# Patient Record
Sex: Female | Born: 1990 | Race: Black or African American | Hispanic: No | Marital: Single | Smoking: Former smoker
Health system: Southern US, Community
[De-identification: ages and names within clinical notes are randomized; demographics above are authoritative.]

## PROBLEM LIST (undated history)

## (undated) DIAGNOSIS — J45909 Unspecified asthma, uncomplicated: Secondary | ICD-10-CM

---

## 2010-02-10 ENCOUNTER — Emergency Department (HOSPITAL_BASED_OUTPATIENT_CLINIC_OR_DEPARTMENT_OTHER): Admission: EM | Admit: 2010-02-10 | Discharge: 2010-02-10 | Payer: Self-pay | Admitting: Emergency Medicine

## 2014-02-10 ENCOUNTER — Encounter (HOSPITAL_BASED_OUTPATIENT_CLINIC_OR_DEPARTMENT_OTHER): Payer: Self-pay | Admitting: Emergency Medicine

## 2014-02-10 ENCOUNTER — Emergency Department (HOSPITAL_BASED_OUTPATIENT_CLINIC_OR_DEPARTMENT_OTHER)
Admission: EM | Admit: 2014-02-10 | Discharge: 2014-02-10 | Disposition: A | Discharge status: Home / Self Care | Payer: 59 | Attending: Emergency Medicine | Admitting: Emergency Medicine

## 2014-02-10 DIAGNOSIS — J018 Other acute sinusitis: Secondary | ICD-10-CM | POA: Insufficient documentation

## 2014-02-10 DIAGNOSIS — Z880 Allergy status to penicillin: Secondary | ICD-10-CM | POA: Diagnosis not present

## 2014-02-10 DIAGNOSIS — J029 Acute pharyngitis, unspecified: Secondary | ICD-10-CM | POA: Diagnosis present

## 2014-02-10 LAB — RAPID STREP SCREEN (MED CTR MEBANE ONLY): Streptococcus, Group A Screen (Direct): NEGATIVE

## 2014-02-10 MED ORDER — AZITHROMYCIN 250 MG PO TABS: 250.0000 mg | ORAL_TABLET | Freq: Every day | ORAL | Status: DC

## 2014-02-10 NOTE — ED Notes (Signed)
Dx with URI last week-c/o cont'd sore throat, head and chest congestion

## 2014-02-10 NOTE — Discharge Instructions (Signed)
Take antibiotic to completion. Use nasal spray and nasal saline. You may continue decongestants.  Sinusitis Sinusitis is redness, soreness, and inflammation of the paranasal sinuses. Paranasal sinuses are air pockets within the bones of your face (beneath the eyes, the middle of the forehead, or above the eyes). In healthy paranasal sinuses, mucus is able to drain out, and air is able to circulate through them by way of your nose. However, when your paranasal sinuses are inflamed, mucus and air can become trapped. This can allow bacteria and other germs to grow and cause infection. Sinusitis can develop quickly and last only a short time (acute) or continue over a long period (chronic). Sinusitis that lasts for more than 12 weeks is considered chronic.  CAUSES  Causes of sinusitis include:  Allergies.  Structural abnormalities, such as displacement of the cartilage that separates your nostrils (deviated septum), which can decrease the air flow through your nose and sinuses and affect sinus drainage.  Functional abnormalities, such as when the small hairs (cilia) that line your sinuses and help remove mucus do not work properly or are not present. SIGNS AND SYMPTOMS  Symptoms of acute and chronic sinusitis are the same. The primary symptoms are pain and pressure around the affected sinuses. Other symptoms include:  Upper toothache.  Earache.  Headache.  Bad breath.  Decreased sense of smell and taste.  A cough, which worsens when you are lying flat.  Fatigue.  Fever.  Thick drainage from your nose, which often is green and may contain pus (purulent).  Swelling and warmth over the affected sinuses. DIAGNOSIS  Your health care provider will perform a physical exam. During the exam, your health care provider may:  Look in your nose for signs of abnormal growths in your nostrils (nasal polyps).  Tap over the affected sinus to check for signs of infection.  View the inside of your  sinuses (endoscopy) using an imaging device that has a light attached (endoscope). If your health care provider suspects that you have chronic sinusitis, one or more of the following tests may be recommended:  Allergy tests.  Nasal culture. A sample of mucus is taken from your nose, sent to a lab, and screened for bacteria.  Nasal cytology. A sample of mucus is taken from your nose and examined by your health care provider to determine if your sinusitis is related to an allergy. TREATMENT  Most cases of acute sinusitis are related to a viral infection and will resolve on their own within 10 days. Sometimes medicines are prescribed to help relieve symptoms (pain medicine, decongestants, nasal steroid sprays, or saline sprays).  However, for sinusitis related to a bacterial infection, your health care provider will prescribe antibiotic medicines. These are medicines that will help kill the bacteria causing the infection.  Rarely, sinusitis is caused by a fungal infection. In theses cases, your health care provider will prescribe antifungal medicine. For some cases of chronic sinusitis, surgery is needed. Generally, these are cases in which sinusitis recurs more than 3 times per year, despite other treatments. HOME CARE INSTRUCTIONS   Drink plenty of water. Water helps thin the mucus so your sinuses can drain more easily.  Use a humidifier.  Inhale steam 3 to 4 times a day (for example, sit in the bathroom with the shower running).  Apply a warm, moist washcloth to your face 3 to 4 times a day, or as directed by your health care provider.  Use saline nasal sprays to help moisten and  clean your sinuses.  Take medicines only as directed by your health care provider.  If you were prescribed either an antibiotic or antifungal medicine, finish it all even if you start to feel better. SEEK IMMEDIATE MEDICAL CARE IF:  You have increasing pain or severe headaches.  You have nausea, vomiting, or  drowsiness.  You have swelling around your face.  You have vision problems.  You have a stiff neck.  You have difficulty breathing. MAKE SURE YOU:   Understand these instructions.  Will watch your condition.  Will get help right away if you are not doing well or get worse. Document Released: 04/09/2005 Document Revised: 08/24/2013 Document Reviewed: 04/24/2011 [facility] Patient Information 2015 [place], [place]. This information is not intended to replace advice given to you by your health care provider. Make sure you discuss any questions you have with your health care provider.

## 2014-02-10 NOTE — ED Provider Notes (Signed)
CSN: [id number]     Arrival date & time 02/10/14  1541 History   First MD Initiated Contact with Patient 02/10/14 1545     Chief Complaint  Patient presents with  . URI     (Consider location/radiation/quality/duration/timing/severity/associated sxs/prior Treatment) HPI This is a 23 year old female who presents to the emergency department complaining of sinus pressure, sore throat and slightly productive cough x2 weeks. Patient was seen one week ago by her PCP who diagnosed her with an upper respiratory infection, told her to take Robitussin with decongestant which she has been doing with no relief. States she has a lot of pressure behind her sinuses and her chest and nose still congested. Denies fever, chills, nausea or vomiting. Her son was recently sick with an ear infection. History reviewed. No pertinent past medical history. History reviewed. No pertinent past surgical history. No family history on file. History  Substance Use Topics  . Smoking status: Never Smoker   . Smokeless tobacco: Not on file  . Alcohol Use: No   OB History   Grav Para Term Preterm Abortions TAB SAB Ect Mult Living                 Review of Systems  Constitutional: Negative for fever and chills.  HENT: Positive for congestion, rhinorrhea, sinus pressure, sore throat and voice change (hoarse voice).   Eyes: Negative.   Respiratory: Positive for cough.   Cardiovascular: Negative for chest pain.  Gastrointestinal: Negative for nausea and vomiting.  Musculoskeletal: Negative.   Skin: Negative.       Allergies  Penicillins  Home Medications   Prior to Admission medications   Medication Sig Start Date End Date Taking? Authorizing Provider  azithromycin (ZITHROMAX) 250 MG tablet Take 1 tablet (250 mg total) by mouth daily. Take first 2 tablets together, then 1 every day until finished. 02/10/14   Zenas Santa M Kevyn Boquet, PA-C   BP 110/70  Pulse 78  Temp(Src) 98.5 F (36.9 C) (Oral)  Resp 16  Ht 5\' 2"   (1.575 m)  SpO2 97%  LMP 02/01/2014 Physical Exam  Nursing note and vitals reviewed. Constitutional: She is oriented to person, place, and time. She appears well-developed and well-nourished. No distress.  HENT:  Head: Normocephalic and atraumatic.  Nasal congestion, mucosal edema, post nasal drip. Post oropharyngeal erythema without edema or exudate. Bilateral frontal and maxillary sinus tenderness.  Eyes: Conjunctivae and EOM are normal.  Neck: Normal range of motion. Neck supple.  Cardiovascular: Normal rate, regular rhythm and normal heart sounds.   Pulmonary/Chest: Effort normal and breath sounds normal. No respiratory distress. She has no wheezes.  Musculoskeletal: Normal range of motion. She exhibits no edema.  Neurological: She is alert and oriented to person, place, and time. No sensory deficit.  Skin: Skin is warm and dry.  Psychiatric: She has a normal mood and affect. Her behavior is normal.    ED Course  Procedures (including critical care time) Labs Review Labs Reviewed  RAPID STREP SCREEN    Imaging Review No results found.   EKG Interpretation None      MDM   Final diagnoses:  Other acute sinusitis   Patient nontoxic appearing and in no apparent distress. Afebrile, vital signs stable. Lungs clear. Given symptoms have been present for 2 weeks and failed outpatient treatment, will treat with antibiotics. Followup with PCP. Stable for discharge. Return precautions given. Patient states understanding of treatment care plan and is agreeable.   Kathrynn SpeedRobyn M Damichael Hofman, PA-C 02/10/14 1621

## 2014-02-10 NOTE — ED Notes (Signed)
Pa  at bedside. 

## 2014-02-10 NOTE — ED Provider Notes (Signed)
Medical screening examination/treatment/procedure(s) were performed by non-physician practitioner and as supervising physician I was immediately available for consultation/collaboration.   Trey Luetta Piazza, MD 02/10/14 2335 

## 2014-02-12 LAB — CULTURE, GROUP A STREP

## 2014-06-26 ENCOUNTER — Emergency Department (HOSPITAL_BASED_OUTPATIENT_CLINIC_OR_DEPARTMENT_OTHER)
Admission: EM | Admit: 2014-06-26 | Discharge: 2014-06-26 | Disposition: A | Discharge status: Home / Self Care | Payer: 59 | Attending: Emergency Medicine | Admitting: Emergency Medicine

## 2014-06-26 ENCOUNTER — Encounter (HOSPITAL_BASED_OUTPATIENT_CLINIC_OR_DEPARTMENT_OTHER): Payer: Self-pay

## 2014-06-26 ENCOUNTER — Emergency Department (HOSPITAL_BASED_OUTPATIENT_CLINIC_OR_DEPARTMENT_OTHER): Payer: 59

## 2014-06-26 DIAGNOSIS — Z791 Long term (current) use of non-steroidal anti-inflammatories (NSAID): Secondary | ICD-10-CM | POA: Diagnosis not present

## 2014-06-26 DIAGNOSIS — Z720 Tobacco use: Secondary | ICD-10-CM | POA: Diagnosis not present

## 2014-06-26 DIAGNOSIS — Z880 Allergy status to penicillin: Secondary | ICD-10-CM | POA: Insufficient documentation

## 2014-06-26 DIAGNOSIS — Z3202 Encounter for pregnancy test, result negative: Secondary | ICD-10-CM | POA: Insufficient documentation

## 2014-06-26 DIAGNOSIS — R079 Chest pain, unspecified: Secondary | ICD-10-CM | POA: Insufficient documentation

## 2014-06-26 DIAGNOSIS — J45901 Unspecified asthma with (acute) exacerbation: Secondary | ICD-10-CM | POA: Diagnosis not present

## 2014-06-26 DIAGNOSIS — Z79899 Other long term (current) drug therapy: Secondary | ICD-10-CM | POA: Insufficient documentation

## 2014-06-26 HISTORY — DX: Unspecified asthma, uncomplicated: J45.909

## 2014-06-26 LAB — URINALYSIS, ROUTINE W REFLEX MICROSCOPIC
BILIRUBIN URINE: NEGATIVE
Glucose, UA: NEGATIVE mg/dL
Hgb urine dipstick: NEGATIVE
Ketones, ur: NEGATIVE mg/dL
Leukocytes, UA: NEGATIVE
NITRITE: NEGATIVE
PH: 7.5 (ref 5.0–8.0)
Protein, ur: NEGATIVE mg/dL
Specific Gravity, Urine: 1.027 (ref 1.005–1.030)
Urobilinogen, UA: 1 mg/dL (ref 0.0–1.0)

## 2014-06-26 LAB — PREGNANCY, URINE: Preg Test, Ur: NEGATIVE

## 2014-06-26 NOTE — ED Provider Notes (Signed)
CSN: [id number]     Arrival date & time 06/26/14  4098 History  This chart was scribed for Rolan Bucco, MD by Haywood Pao, ED Scribe. The patient was seen in [facility] and the patient's care was started at 10:25 PM.  Chief Complaint  Patient presents with  . Chest Pain   Patient is a 24 y.o. female presenting with chest pain. The history is provided by the patient. No language interpreter was used.  Chest Pain Associated symptoms: shortness of breath   Associated symptoms: no abdominal pain, no back pain, no cough, no diaphoresis, no dizziness, no fatigue, no fever, no headache, no nausea, no numbness, not vomiting and no weakness     HPI Comments: Alexandra Carrillo is a 24 y.o. female with a history of asthma who presents to the Emergency Department complaining of a gradually worsening, constant chest pain, onset this morning. Pt states the pain has been ongoing all day but worsened around 5:30 PM after work. The pain is located in the middle of her chest and radiates under her both breast. She has improving SOB as an associated symptom. Pt has used her albuterol inhaler and got relief of symptoms after this. Nothing makes the pain worse. Pt states this does not feel like her normal asthma flare up. Pt has a birth control implant in her arm. She does smoke cigarettes. She denies a cough, congestion, fever, leg swelling, pain with breathing abdominal pain, nausea, vomiting, diarrhea, dizziness.  Past Medical History  Diagnosis Date  . Asthma    History reviewed. No pertinent past surgical history. No family history on file. History  Substance Use Topics  . Smoking status: Current Every Day Smoker -- 0.50 packs/day    Types: Cigarettes  . Smokeless tobacco: Not on file  . Alcohol Use: No   OB History    No data available     Review of Systems  Constitutional: Negative for fever, chills, diaphoresis and fatigue.  HENT: Negative for congestion, rhinorrhea and sneezing.   Eyes:  Negative.   Respiratory: Positive for shortness of breath. Negative for cough and chest tightness.   Cardiovascular: Positive for chest pain. Negative for leg swelling.  Gastrointestinal: Negative for nausea, vomiting, abdominal pain, diarrhea and blood in stool.  Genitourinary: Negative for frequency, hematuria, flank pain and difficulty urinating.  Musculoskeletal: Negative for back pain and arthralgias.  Skin: Negative for rash.  Neurological: Negative for dizziness, speech difficulty, weakness, numbness and headaches.   Allergies  Penicillins  Home Medications   Prior to Admission medications   Medication Sig Start Date End Date Taking? Authorizing Provider  albuterol (PROVENTIL) (5 MG/ML) 0.5% nebulizer solution Take 2.5 mg by nebulization every 6 (six) hours as needed for wheezing or shortness of breath.   Yes Historical Provider, MD  azithromycin (ZITHROMAX) 250 MG tablet Take 1 tablet (250 mg total) by mouth daily. Take first 2 tablets together, then 1 every day until finished. 02/10/14   Robyn M Hess, PA-C   BP 105/88 mmHg  Pulse 76  Temp(Src) 98.3 F (36.8 C) (Oral)  Resp 20  Ht  (1.549 m)  Wt 132 lb (59.875 kg)  BMI 24.95 kg/m2  SpO2 100%  LMP 06/26/2014 Physical Exam  Constitutional: She is oriented to person, place, and time. She appears well-developed and well-nourished.  HENT:  Head: Normocephalic and atraumatic.  Eyes: Pupils are equal, round, and reactive to light.  Neck: Normal range of motion. Neck supple.  Cardiovascular: Normal rate, regular rhythm  and normal heart sounds.   Pulmonary/Chest: Effort normal and breath sounds normal. No respiratory distress. She has no wheezes. She has no rales. She exhibits tenderness (ppatient is reproducible tenderness along the sternum).  Abdominal: Soft. Bowel sounds are normal. There is no tenderness. There is no rebound and no guarding.  Musculoskeletal: Normal range of motion. She exhibits no edema.   Lymphadenopathy:    She has no cervical adenopathy.  Neurological: She is alert and oriented to person, place, and time.  Skin: Skin is warm and dry. No rash noted.  Psychiatric: She has a normal mood and affect.    ED Course  Procedures  DIAGNOSTIC STUDIES: Oxygen Saturation is 100% on room air, normal by my interpretation.    COORDINATION OF CARE: 10:30 PM Discussed treatment plan with pt at bedside and pt agreed to plan.  Labs Review Labs Reviewed  URINALYSIS, ROUTINE W REFLEX MICROSCOPIC  PREGNANCY, URINE    Imaging Review Dg Chest 2 View  06/26/2014   CLINICAL DATA:  Initial evaluation chest pain which began this morning  EXAM: CHEST  2 VIEW  COMPARISON:  None.  FINDINGS: The heart size and mediastinal contours are within normal limits. Both lungs are clear. The visualized skeletal structures are unremarkable.  IMPRESSION: No active cardiopulmonary disease.   Electronically Signed   By: Esperanza Heiraymond  Rubner M.D.   On: 06/26/2014 23:04     EKG Interpretation None      Date: 06/26/2014  Rate: 73  Rhythm: normal sinus rhythm  QRS Axis: normal  Intervals: normal  ST/T Wave abnormalities: normal  Conduction Disutrbances:none  Narrative Interpretation:   Old EKG Reviewed: none available   MDM   Final diagnoses:  Chest pain, unspecified chest pain type   , Patient presents with chest pain along her center of her chest and radiating to both sides. The pain is reproducible and did improve after using her inhaler. Her EKG does not show ischemia. Her chest x-ray is negative for infiltrates or pneumothorax. She's feeling much better now. She was discharged home in good condition. She was instructed to use her albuterol regularly for the next few days and use ibuprofen for the discomfort. She was advised to follow-up with her primary care physician if her symptoms continue or return here as needed if her symptoms worsen.   I personally performed the services described in this  documentation, which was scribed in my presence.  The recorded information has been reviewed and considered.      Rolan BuccoMelanie Cindy Brindisi, MD 06/26/14 [phone]

## 2014-06-26 NOTE — ED Notes (Signed)
Pt reports today with intermittent pain in central chest, now radiating to bilateral chest walls and bilateral upper quads of abdomen.  Reports sob, states she used inhaler earlier today, hx of asthma.  Denies additional symptoms.

## 2014-06-26 NOTE — ED Notes (Signed)
Pt dressing self, "ready to go", denies questions or needs, steady gait, "feel better".

## 2014-06-26 NOTE — ED Notes (Addendum)
Pt alert, NAD, calm, interactive, no dyspnea noted, speaking in clear complete sentences. Last inhaler use 3 hrs ago, LS CTA, last ate 1815. (denies: cough, congestion, cold sx, fever, nvd or dizziness), admits to a little sob at this time, rates pain at this time 8/10. Sternal area pain.

## 2014-06-26 NOTE — Discharge Instructions (Signed)

## 2014-06-30 ENCOUNTER — Emergency Department (HOSPITAL_BASED_OUTPATIENT_CLINIC_OR_DEPARTMENT_OTHER): Payer: Medicaid Other

## 2014-06-30 ENCOUNTER — Emergency Department (HOSPITAL_BASED_OUTPATIENT_CLINIC_OR_DEPARTMENT_OTHER)
Admission: EM | Admit: 2014-06-30 | Discharge: 2014-06-30 | Disposition: A | Discharge status: Home / Self Care | Payer: Medicaid Other | Attending: Emergency Medicine | Admitting: Emergency Medicine

## 2014-06-30 ENCOUNTER — Encounter (HOSPITAL_BASED_OUTPATIENT_CLINIC_OR_DEPARTMENT_OTHER): Payer: Self-pay | Admitting: *Deleted

## 2014-06-30 DIAGNOSIS — Z87891 Personal history of nicotine dependence: Secondary | ICD-10-CM | POA: Insufficient documentation

## 2014-06-30 DIAGNOSIS — Z792 Long term (current) use of antibiotics: Secondary | ICD-10-CM | POA: Diagnosis not present

## 2014-06-30 DIAGNOSIS — S3991XA Unspecified injury of abdomen, initial encounter: Secondary | ICD-10-CM | POA: Diagnosis present

## 2014-06-30 DIAGNOSIS — Z880 Allergy status to penicillin: Secondary | ICD-10-CM | POA: Diagnosis not present

## 2014-06-30 DIAGNOSIS — Y9389 Activity, other specified: Secondary | ICD-10-CM | POA: Insufficient documentation

## 2014-06-30 DIAGNOSIS — Y998 Other external cause status: Secondary | ICD-10-CM | POA: Insufficient documentation

## 2014-06-30 DIAGNOSIS — Y92410 Unspecified street and highway as the place of occurrence of the external cause: Secondary | ICD-10-CM | POA: Diagnosis not present

## 2014-06-30 DIAGNOSIS — J45909 Unspecified asthma, uncomplicated: Secondary | ICD-10-CM | POA: Insufficient documentation

## 2014-06-30 DIAGNOSIS — S4991XA Unspecified injury of right shoulder and upper arm, initial encounter: Secondary | ICD-10-CM | POA: Diagnosis not present

## 2014-06-30 DIAGNOSIS — S7002XA Contusion of left hip, initial encounter: Secondary | ICD-10-CM | POA: Diagnosis not present

## 2014-06-30 DIAGNOSIS — R1030 Lower abdominal pain, unspecified: Secondary | ICD-10-CM

## 2014-06-30 DIAGNOSIS — Z3202 Encounter for pregnancy test, result negative: Secondary | ICD-10-CM | POA: Insufficient documentation

## 2014-06-30 DIAGNOSIS — M7918 Myalgia, other site: Secondary | ICD-10-CM

## 2014-06-30 LAB — URINALYSIS, ROUTINE W REFLEX MICROSCOPIC
Glucose, UA: NEGATIVE mg/dL
Hgb urine dipstick: NEGATIVE
Ketones, ur: 15 mg/dL — AB
Leukocytes, UA: NEGATIVE
NITRITE: NEGATIVE
Protein, ur: NEGATIVE mg/dL
SPECIFIC GRAVITY, URINE: 1.036 — AB (ref 1.005–1.030)
UROBILINOGEN UA: 1 mg/dL (ref 0.0–1.0)
pH: 6 (ref 5.0–8.0)

## 2014-06-30 LAB — CREATININE, SERUM
Creatinine, Ser: 0.82 mg/dL (ref 0.50–1.10)
GFR calc Af Amer: >90 mL/min (ref >90)
GFR calc non Af Amer: >90 mL/min (ref >90)

## 2014-06-30 LAB — PREGNANCY, URINE: Preg Test, Ur: NEGATIVE

## 2014-06-30 MED ORDER — IOHEXOL 300 MG/ML  SOLN
100.0000 mL | Freq: Once | INTRAMUSCULAR | Status: AC | PRN
  Administered 2014-06-30: 100 mL via INTRAVENOUS

## 2014-06-30 MED ORDER — OXYCODONE-ACETAMINOPHEN 5-325 MG PO TABS: ORAL_TABLET | ORAL | Status: DC

## 2014-06-30 MED ORDER — MORPHINE SULFATE 4 MG/ML IJ SOLN
4.0000 mg | Freq: Once | INTRAMUSCULAR | Status: AC
  Administered 2014-06-30: 4 mg via INTRAVENOUS
  Filled 2014-06-30: qty 1

## 2014-06-30 MED ORDER — ONDANSETRON HCL 4 MG/2ML IJ SOLN
4.0000 mg | Freq: Once | INTRAMUSCULAR | Status: AC
  Administered 2014-06-30: 4 mg via INTRAVENOUS
  Filled 2014-06-30: qty 2
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
## 2014-06-30 NOTE — ED Notes (Signed)
Patient asked to change into a gown.  
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
## 2014-06-30 NOTE — Discharge Instructions (Signed)
Take percocet for breakthrough pain, do not drink alcohol, drive, care for children or do other critical tasks while taking percocet. ° °Please follow with your primary care doctor in the next 2 days for a check-up. They must obtain records for further management.  ° °Do not hesitate to return to the Emergency Department for any new, worsening or concerning symptoms.  ° °

## 2016-02-24 ENCOUNTER — Encounter (HOSPITAL_BASED_OUTPATIENT_CLINIC_OR_DEPARTMENT_OTHER): Payer: Self-pay | Admitting: *Deleted

## 2016-02-24 ENCOUNTER — Emergency Department (HOSPITAL_BASED_OUTPATIENT_CLINIC_OR_DEPARTMENT_OTHER): Payer: 59

## 2016-02-24 ENCOUNTER — Emergency Department (HOSPITAL_BASED_OUTPATIENT_CLINIC_OR_DEPARTMENT_OTHER)
Admission: EM | Admit: 2016-02-24 | Discharge: 2016-02-24 | Disposition: A | Discharge status: Home / Self Care | Payer: 59 | Attending: Emergency Medicine | Admitting: Emergency Medicine

## 2016-02-24 DIAGNOSIS — J069 Acute upper respiratory infection, unspecified: Secondary | ICD-10-CM | POA: Diagnosis not present

## 2016-02-24 DIAGNOSIS — J45909 Unspecified asthma, uncomplicated: Secondary | ICD-10-CM | POA: Diagnosis not present

## 2016-02-24 DIAGNOSIS — Z87891 Personal history of nicotine dependence: Secondary | ICD-10-CM | POA: Diagnosis not present

## 2016-02-24 DIAGNOSIS — R05 Cough: Secondary | ICD-10-CM | POA: Diagnosis present

## 2016-02-24 LAB — RAPID STREP SCREEN (MED CTR MEBANE ONLY): STREPTOCOCCUS, GROUP A SCREEN (DIRECT): NEGATIVE

## 2016-02-24 MED ORDER — DEXAMETHASONE SODIUM PHOSPHATE 10 MG/ML IJ SOLN
10.0000 mg | Freq: Once | INTRAMUSCULAR | Status: AC
  Administered 2016-02-24: 10 mg via INTRAMUSCULAR
  Filled 2016-02-24: qty 1

## 2016-02-24 NOTE — ED Provider Notes (Signed)
[facility] Provider Note   CSN: [id number] Arrival date & time: 02/24/16  1738  By signing my name below, I, Alexandra Carrillo, attest that this documentation has been prepared under the direction and in the presence of non-physician practitioner, Alexandra Mornavid Gabriellia Rempel, NP. Electronically Signed: Modena JanskyAlbert Carrillo, Scribe. 02/24/2016. 6:17 PM.  History   Chief Complaint Chief Complaint  Patient presents with  . URI   The history is provided by the patient. No language interpreter was used.   HPI Comments: Alexandra Carrillo is a 25 y.o. female who presents to the Emergency Department complaining of intermittent moderate cough that started about 5 days ago. She states she has been having URI-like symptoms that remained unchanged after seeing her PCP. She reports associated symptoms of sore throat and a subjective fever. She states that she had a strep test done when she saw her PCP, which was negative. She tried cough medication prescribed by PCP, tylenol, ibuprofen, and cough drops with minimal relief. She describes the sore throat as exacerbated by swallowing. She denies any rhinorrhea, nausea, vomiting, rash, or other complaints.   Past Medical History:  Diagnosis Date  . Asthma     There are no active problems to display for this patient.   History reviewed. No pertinent surgical history.  OB History    No data available       Home Medications    Prior to Admission medications   Medication Sig Start Date End Date Taking? Authorizing Provider  albuterol (PROVENTIL) (5 MG/ML) 0.5% nebulizer solution Take 2.5 mg by nebulization every 6 (six) hours as needed for wheezing or shortness of breath.    Historical Provider, MD    Family History History reviewed. No pertinent family history.  Social History Social History  Substance Use Topics  . Smoking status: Former Smoker    Packs/day: 0.50    Types: Cigarettes  . Smokeless tobacco: Not on file  . Alcohol use No     Allergies    Penicillins   Review of Systems Review of Systems  Constitutional: Positive for fever (Subjective).  HENT: Positive for sore throat. Negative for rhinorrhea.   Gastrointestinal: Negative for nausea and vomiting.  Skin: Negative for rash.  All other systems reviewed and are negative.    Physical Exam Updated Vital Signs BP 113/80 (BP Location: Left Arm)   Pulse 95   Temp 98.4 F (36.9 C) (Oral)   Resp 18   Ht 5\' 2"  (1.575 m)   Wt 150 lb (68 kg)   LMP 02/12/2016   SpO2 97%   BMI 27.44 kg/m   Physical Exam  Constitutional: She appears well-developed and well-nourished. No distress.  HENT:  Head: Normocephalic.  Right Ear: Tympanic membrane normal.  Left Ear: Tympanic membrane normal.  Erythema to the posterior oropharynx.   Eyes: Conjunctivae are normal.  Neck: Neck supple.  Cardiovascular: Normal rate and regular rhythm.   Pulmonary/Chest: Effort normal.  Abdominal: Soft.  Musculoskeletal: Normal range of motion.  Lymphadenopathy:    She has no cervical adenopathy.  Neurological: She is alert.  Skin: Skin is warm and dry.  Psychiatric: She has a normal mood and affect.  Nursing note and vitals reviewed.    ED Treatments / Results  DIAGNOSTIC STUDIES: Oxygen Saturation is 97% on RA, normal by my interpretation.    COORDINATION OF CARE: 6:21 PM- Pt advised of plan for treatment and pt agrees.  Labs (all labs ordered are listed, but only abnormal results are displayed) Labs Reviewed  RAPID STREP SCREEN (NOT AT [facility])    EKG  EKG Interpretation None       Radiology Dg Chest 2 View  Result Date: 02/24/2016 CLINICAL DATA:  Acute onset of cough and congestion. Initial encounter. EXAM: CHEST  2 VIEW COMPARISON:  Chest radiograph performed 06/26/2014 FINDINGS: The lungs are well-aerated and clear. There is no evidence of focal opacification, pleural effusion or pneumothorax. The heart is normal in size; the mediastinal contour is within normal limits. No  acute osseous abnormalities are seen. IMPRESSION: No acute cardiopulmonary process seen. Electronically Signed   By: Roanna RaiderJeffery  Chang M.D.   On: 02/24/2016 18:51    Procedures Procedures (including critical care time)  Medications Ordered in ED Medications - No data to display   Initial Impression / Assessment and Plan / ED Course  I have reviewed the triage vital signs and the nursing notes.  Pertinent labs & imaging results that were available during my care of the patient were reviewed by me and considered in my medical decision making (see chart for details).  Clinical Course   Pt symptoms consistent with URI. CXR negative for acute infiltrate. Negative strep. Pt will be discharged with symptomatic treatment.  Discussed return precautions.  Pt is hemodynamically stable & in NAD prior to discharge.   Final Clinical Impressions(s) / ED Diagnoses   Final diagnoses:  Viral upper respiratory tract infection    New Prescriptions New Prescriptions   No medications on file   I personally performed the services described in this documentation, which was scribed in my presence. The recorded information has been reviewed and is accurate.     Alexandra Mornavid Emre Stock, NP 02/24/16 2300    Geoffery Lyonsouglas Delo, MD 02/25/16 0000

## 2016-02-24 NOTE — ED Notes (Signed)
Pt. Reports she has been out of work all week because she has a sore throat and cold symptoms.  Pt. Reports she saw her PMD at [facility].  Pt. Reports she had strep test and it was negative.  Pt. Still reports sore throat.

## 2016-02-24 NOTE — ED Triage Notes (Signed)
Pt reports cough since Sunday.  Reports negative strep test at her PCP this week, reports being rx a cough medication that she does not feel like is helping. Denies fever.

## 2016-02-27 LAB — CULTURE, GROUP A STREP (THRC)

## 2017-06-27 IMAGING — DX DG CHEST 2V
2 series · 2 of 2 positions shown · non-contrast
Comparison: Chest radiograph performed 06/26/2014

CLINICAL DATA: Acute onset of cough and congestion. Initial
encounter.

EXAM:
CHEST  2 VIEW

[chest pa]
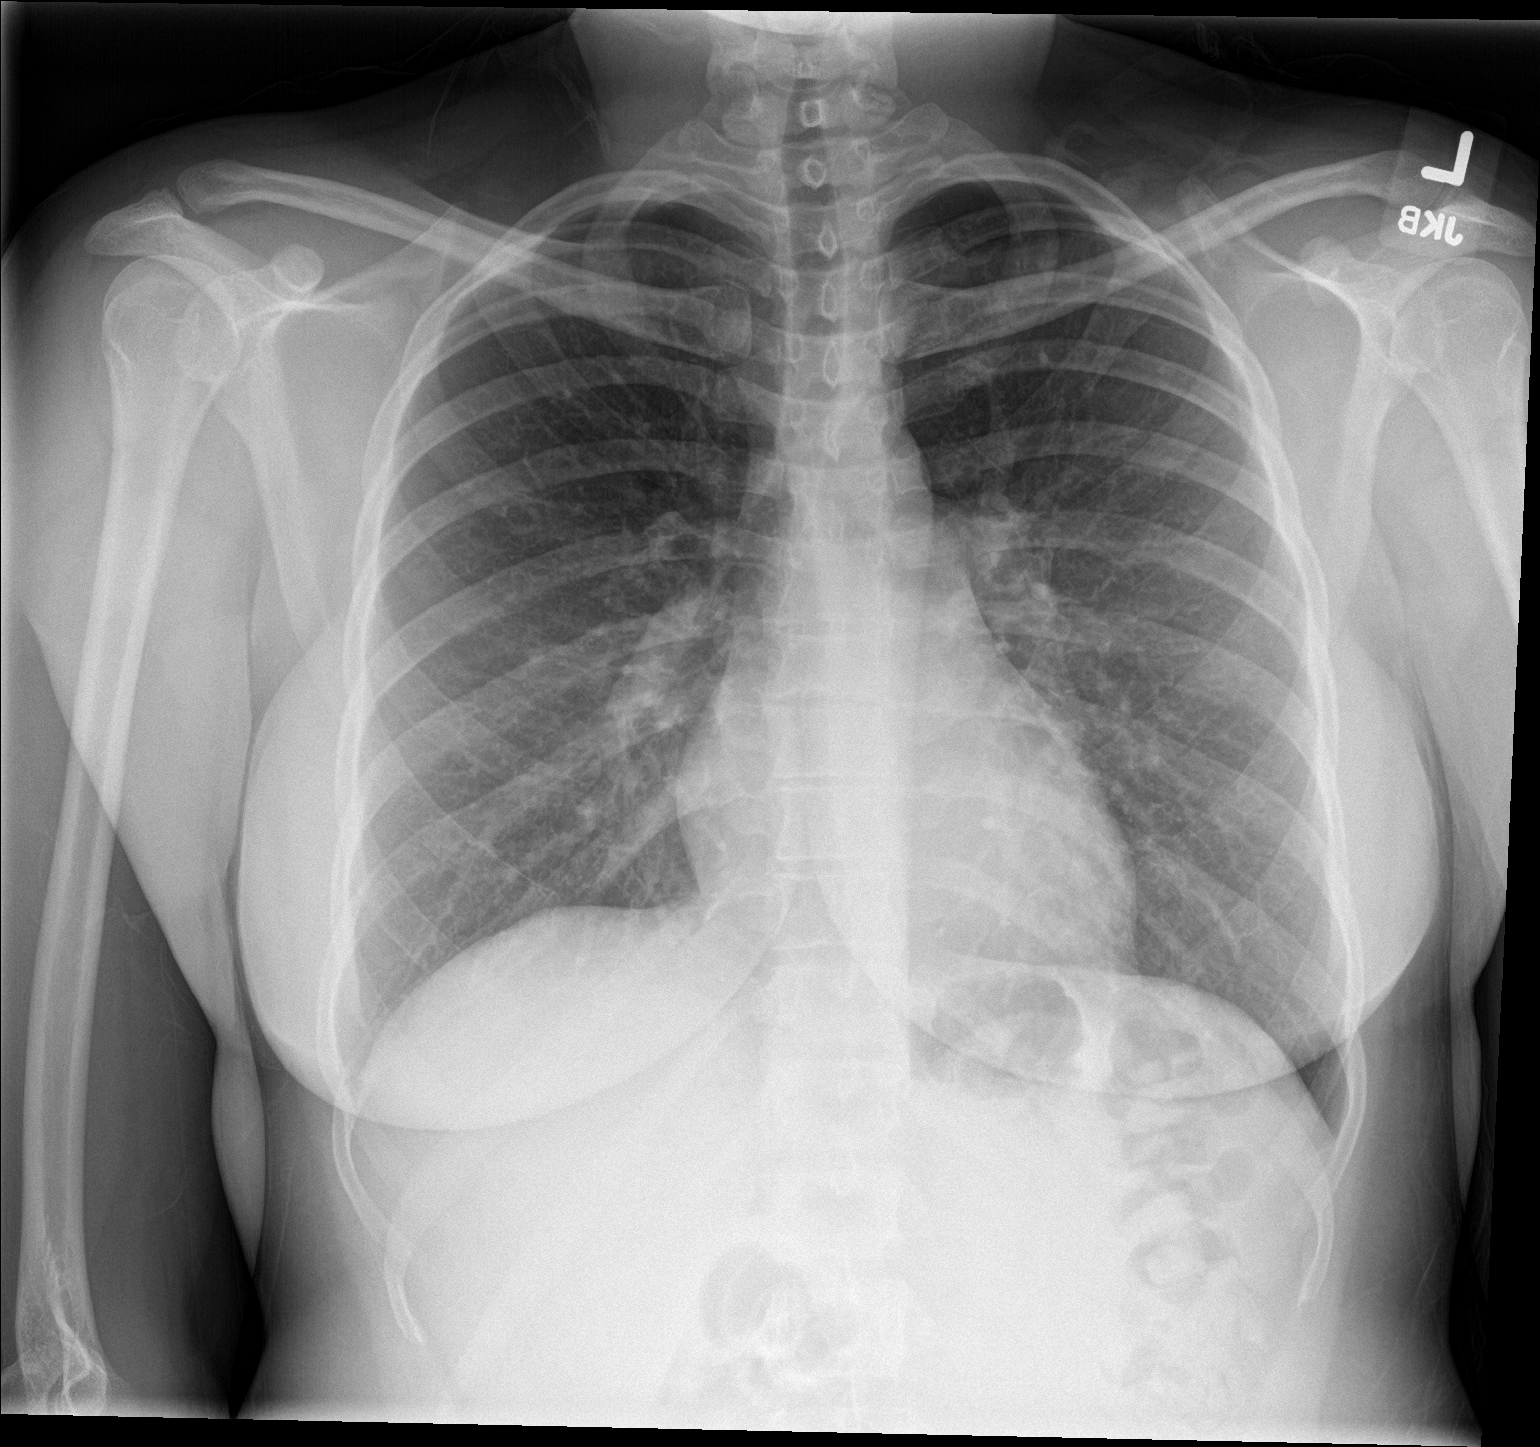

[chest lat]
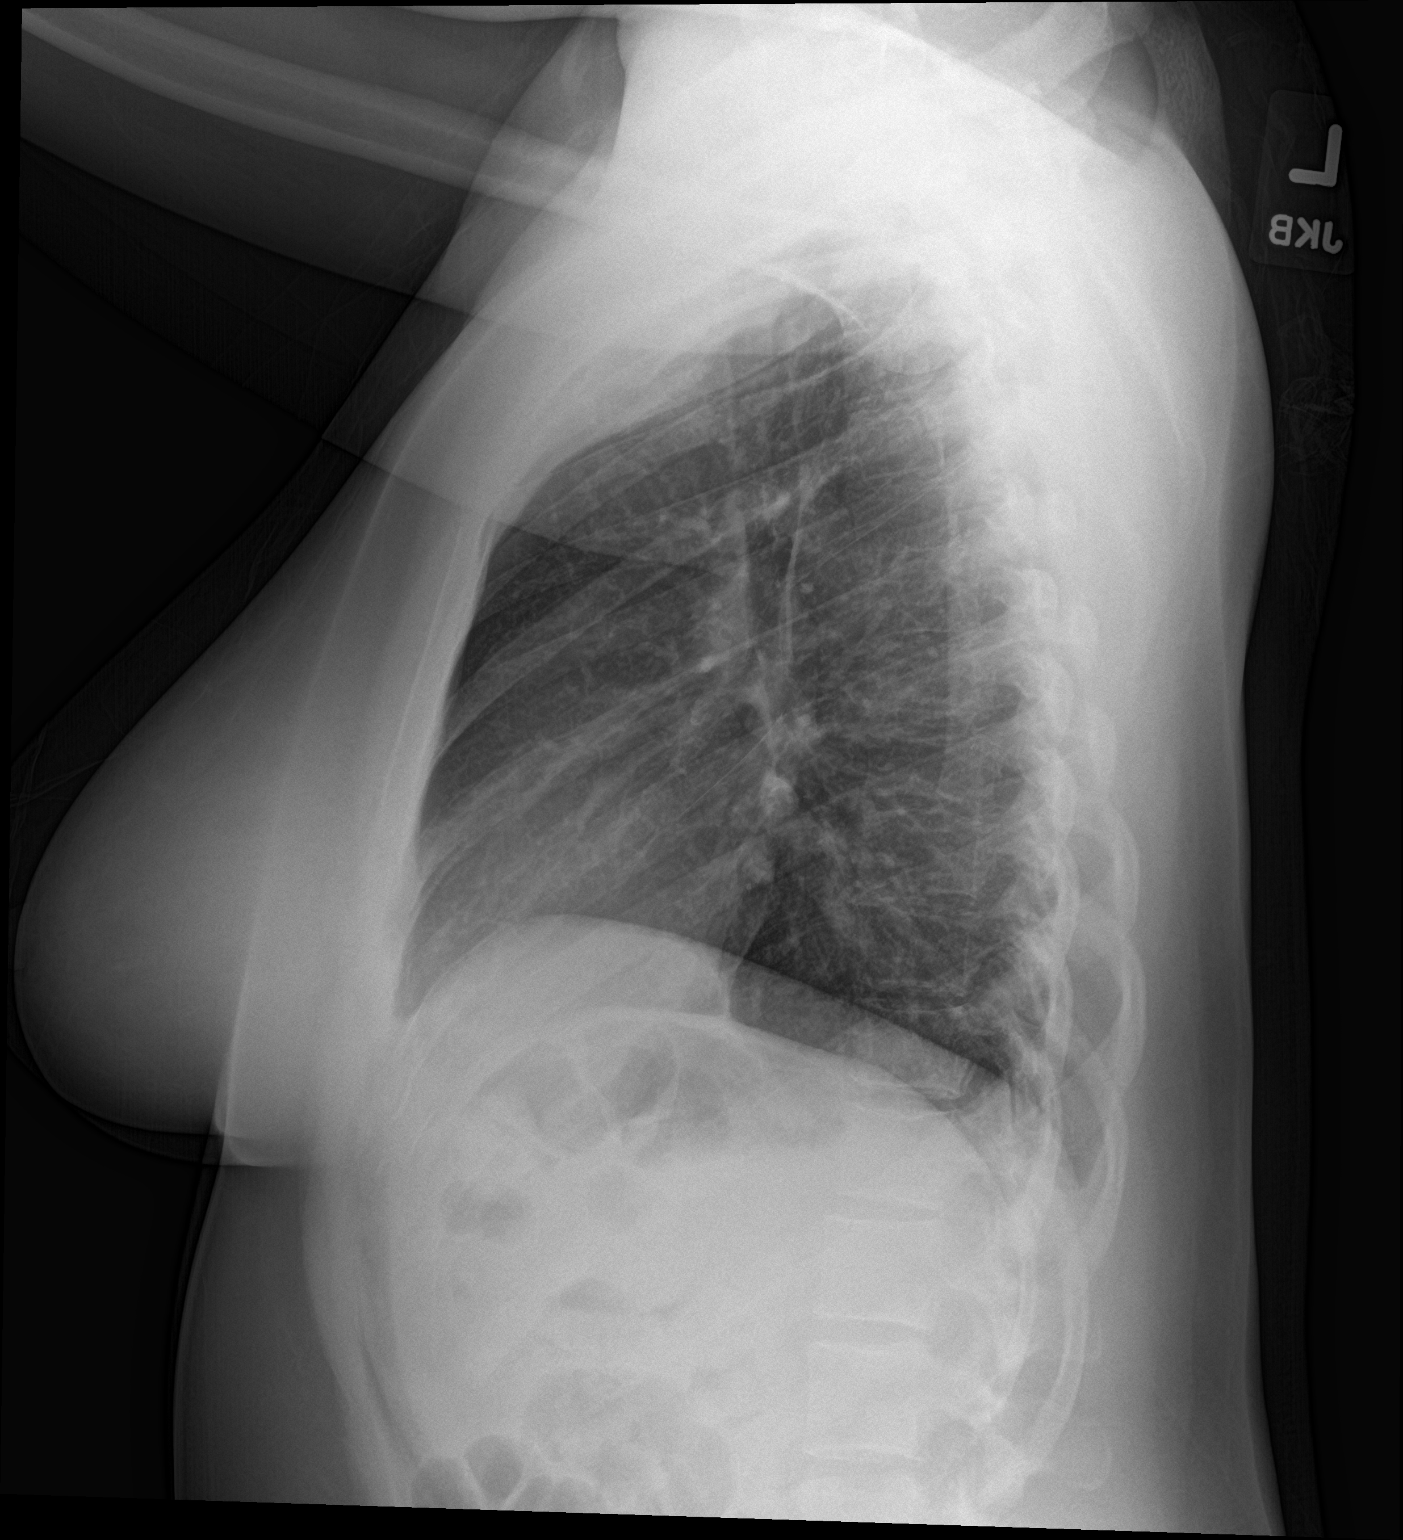

[2 of 2 positions shown; findings below may reference images not displayed]

FINDINGS: The lungs are well-aerated and clear. There is no evidence of focal
opacification, pleural effusion or pneumothorax.

The heart is normal in size; the mediastinal contour is within
normal limits. No acute osseous abnormalities are seen.
IMPRESSION: No acute cardiopulmonary process seen.

## 2018-03-27 ENCOUNTER — Emergency Department (HOSPITAL_BASED_OUTPATIENT_CLINIC_OR_DEPARTMENT_OTHER)
Admission: EM | Admit: 2018-03-27 | Discharge: 2018-03-27 | Disposition: A | Discharge status: Home / Self Care | Payer: BC Managed Care – PPO | Attending: Emergency Medicine | Admitting: Emergency Medicine

## 2018-03-27 ENCOUNTER — Encounter (HOSPITAL_BASED_OUTPATIENT_CLINIC_OR_DEPARTMENT_OTHER): Payer: Self-pay | Admitting: *Deleted

## 2018-03-27 ENCOUNTER — Other Ambulatory Visit: Payer: Self-pay

## 2018-03-27 DIAGNOSIS — J45909 Unspecified asthma, uncomplicated: Secondary | ICD-10-CM | POA: Insufficient documentation

## 2018-03-27 DIAGNOSIS — R69 Illness, unspecified: Secondary | ICD-10-CM

## 2018-03-27 DIAGNOSIS — Z87891 Personal history of nicotine dependence: Secondary | ICD-10-CM | POA: Insufficient documentation

## 2018-03-27 DIAGNOSIS — J111 Influenza due to unidentified influenza virus with other respiratory manifestations: Secondary | ICD-10-CM | POA: Insufficient documentation

## 2018-03-27 DIAGNOSIS — R51 Headache: Secondary | ICD-10-CM | POA: Diagnosis present

## 2018-03-27 MED ORDER — BENZONATATE 200 MG PO CAPS: 200.0000 mg | ORAL_CAPSULE | Freq: Three times a day (TID) | ORAL | 0 refills | Status: AC | PRN

## 2018-03-27 MED ORDER — HYDROCODONE-ACETAMINOPHEN 5-325 MG PO TABS
1.0000 | ORAL_TABLET | Freq: Once | ORAL | Status: AC
  Administered 2018-03-27: 1 via ORAL
  Filled 2018-03-27: qty 1

## 2018-03-27 MED FILL — BENZONATATE 200 MG CAPS: 200 | 10 days supply | Qty: 30 | Fill #0

## 2018-03-27 NOTE — Discharge Instructions (Signed)
°  Sorry you're not feeling well! Please drink plenty of fluids to stay hydrated. Take tylenol (500 mg) and ibuprofen (400-600mg ) every 6 hours for pain. Please return if you feel worse instead of better or have high fevers. Practice good hand washing to avoid getting others sick.

## 2018-03-27 NOTE — ED Triage Notes (Signed)
Pt has had generalized body aches, with a sore throat and headache for 3 days.

## 2018-03-27 NOTE — ED Provider Notes (Signed)
[facility] EMERGENCY DEPARTMENT Provider Note   CSN: [id number] Arrival date & time: 03/27/18  [id number]  History   Chief Complaint Chief Complaint  Patient presents with  . Sore Throat  . Generalized Body Aches    HPI Alexandra Carrillo is a 27 y.o. female presenting with 3 days of headache, sore throat, body aches. She reports she lost her voice. She has had fevers and chills at home, has not formally checked her temperature. She has decreased appetite but is drinking normally. No urinary complaints. Reports constipation, no diarrhea or nausea. Had one episode of vomiting after coughing really hard. She has tried 200 mg ibuprofen with no relief. She is taking nyquil at night to help her sleep. No sick contacts that she is aware of.   HPI  Past Medical History:  Diagnosis Date  . Asthma     There are no active problems to display for this patient.   History reviewed. No pertinent surgical history.   OB History   None      Home Medications    Prior to Admission medications   Medication Sig Start Date End Date Taking? Authorizing Provider  albuterol (PROVENTIL) (5 MG/ML) 0.5% nebulizer solution Take 2.5 mg by nebulization every 6 (six) hours as needed for wheezing or shortness of breath.    [provider]  benzonatate (TESSALON) 200 MG capsule Take 1 capsule (200 mg total) by mouth 3 (three) times daily as needed for cough. 03/27/18   Tillman Sersiccio, Ardenia Stiner C, DO    Family History History reviewed. No pertinent family history.  Social History Social History   Tobacco Use  . Smoking status: Former Smoker    Packs/day: 0.50    Types: Cigarettes  . Smokeless tobacco: Never Used  Substance Use Topics  . Alcohol use: No  . Drug use: No     Allergies   Penicillins   Review of Systems Review of Systems  Constitutional: Positive for activity change, appetite change, chills and fever. Negative for diaphoresis and fatigue.  HENT: Positive for sore throat and  voice change. Negative for congestion, ear pain, rhinorrhea, sinus pressure and sinus pain.   Eyes: Negative for redness.  Respiratory: Positive for cough. Negative for shortness of breath and wheezing.   Cardiovascular: Negative for chest pain.  Gastrointestinal: Positive for constipation and vomiting (post-tussive). Negative for abdominal pain, diarrhea and nausea.  Genitourinary: Negative for decreased urine volume, dysuria and urgency.  Musculoskeletal: Positive for myalgias and neck pain. Negative for neck stiffness.  Skin: Negative for rash.  Allergic/Immunologic: Negative for immunocompromised state.  Neurological: Positive for headaches. Negative for weakness and numbness.     Physical Exam Updated Vital Signs BP 118/79 (BP Location: Right Arm)   Temp 99.8 F (37.7 C) (Oral)   Resp 18   Ht 5\' 2"  (1.575 m)   Wt 67.1 kg   LMP 03/07/2018 (Exact Date)   SpO2 100%   BMI 27.07 kg/m   Physical Exam  Constitutional: She is oriented to person, place, and time. She appears well-developed and well-nourished.  HENT:  Head: Normocephalic and atraumatic.  Right Ear: Tympanic membrane normal.  Left Ear: Tympanic membrane normal.  Mouth/Throat: Oropharynx is clear and moist and mucous membranes are normal. No oropharyngeal exudate or posterior oropharyngeal erythema. No tonsillar exudate.  Eyes: Pupils are equal, round, and reactive to light. EOM are normal.  Neck: Normal range of motion. Neck supple.  Cardiovascular: Normal rate and regular rhythm.  Pulmonary/Chest: Effort normal and breath  sounds normal.  Abdominal: Soft. She exhibits no distension. There is no tenderness.  Lymphadenopathy:    She has no cervical adenopathy.  Neurological: She is alert and oriented to person, place, and time.  Skin: Skin is warm and dry.  Psychiatric: She has a normal mood and affect. Her behavior is normal.  Nursing note and vitals reviewed.    ED Treatments / Results  Labs (all labs  ordered are listed, but only abnormal results are displayed) Labs Reviewed - No data to display  EKG None  Radiology No results found.  Procedures Procedures (including critical care time)  Medications Ordered in ED Medications  HYDROcodone-acetaminophen (NORCO/VICODIN) 5-325 MG per tablet 1 tablet (1 tablet Oral Given 03/27/18 0851)     Initial Impression / Assessment and Plan / ED Course  I have reviewed the triage vital signs and the nursing notes.  Pertinent labs & imaging results that were available during my care of the patient were reviewed by me and considered in my medical decision making (see chart for details).     27 year old presenting with 3 days of influenza like illness. Lungs are clear on exam. Throat without exudate or cervical lymphadenopathy. No concern for strep or other bacterial infection. Discussed patient may have flu but is outside window for treatment and therefore would not test as it would not change management. Encouraged fluids, tylenol and ibuprofen, cough drops, and rest. Discussed reasons to return including worsening or high fevers. Work note given. Gave norco to help with body aches, sore throat, and cough in ED prior to D/C home. rx for tessalon perles given. Patient stable for discharge home. Patient verbalized understanding and agreement with plan.   Final Clinical Impressions(s) / ED Diagnoses   Final diagnoses:  Influenza-like illness    ED Discharge Orders         Ordered    benzonatate (TESSALON) 200 MG capsule  3 times daily PRN     03/27/18 0844         Dolores Patty, DO PGY-3, [facility] 03/27/2018 9:04 AM    Tillman Sers, DO 03/27/18 6295    Jacalyn Lefevre, MD 03/27/18 1112

## 2019-04-03 ENCOUNTER — Other Ambulatory Visit: Payer: Self-pay

## 2019-04-03 DIAGNOSIS — Z20822 Contact with and (suspected) exposure to covid-19: Secondary | ICD-10-CM

## 2019-04-05 LAB — NOVEL CORONAVIRUS, NAA: SARS-CoV-2, NAA: NOT DETECTED

## 2019-08-01 ENCOUNTER — Other Ambulatory Visit: Payer: Self-pay

## 2019-08-01 ENCOUNTER — Ambulatory Visit: Payer: BC Managed Care – PPO | Attending: Internal Medicine

## 2019-08-01 DIAGNOSIS — Z23 Encounter for immunization: Secondary | ICD-10-CM

## 2019-08-01 NOTE — Progress Notes (Signed)
   Covid-19 Vaccination Clinic  Name:  Alexandra Carrillo    MRN: [id number] DOB: Aug 30, 1990  08/01/2019  Ms. Alexandra Carrillo was observed post Covid-19 immunization for 15 minutes without incident. She was provided with Vaccine Information Sheet and instruction to access the V-Safe system.   Ms. Alexandra Carrillo was instructed to call 911 with any severe reactions post vaccine: Marland Kitchen Difficulty breathing  . Swelling of face and throat  . A fast heartbeat  . A bad rash all over body  . Dizziness and weakness   Immunizations Administered    Name Date Dose VIS Date Route   Pfizer COVID-19 Vaccine 08/01/2019  3:35 PM 0.3 mL 04/03/2019 Intramuscular   Manufacturer: [employer], [employer]   Lot: [id number]   NDC: [address]-7

## 2019-08-26 ENCOUNTER — Ambulatory Visit: Payer: BC Managed Care – PPO | Attending: Internal Medicine

## 2019-08-26 DIAGNOSIS — Z23 Encounter for immunization: Secondary | ICD-10-CM

## 2019-08-26 NOTE — Progress Notes (Signed)
   Covid-19 Vaccination Clinic  Name:  Mekaylah Klich    MRN: [id number] DOB: May 14, 1990  08/26/2019  Ms. Paternostro was observed post Covid-19 immunization for 15 minutes without incident. She was provided with Vaccine Information Sheet and instruction to access the V-Safe system.   Ms. Besse was instructed to call 911 with any severe reactions post vaccine: Marland Kitchen Difficulty breathing  . Swelling of face and throat  . A fast heartbeat  . A bad rash all over body  . Dizziness and weakness   Immunizations Administered    Name Date Dose VIS Date Route   Pfizer COVID-19 Vaccine 08/26/2019  4:37 PM 0.3 mL 06/17/2018 Intramuscular   Manufacturer: [employer], [employer]   Lot: [id number]   NDC: [address]-9

## 2023-08-12 ENCOUNTER — Other Ambulatory Visit: Payer: Self-pay

## 2023-08-12 ENCOUNTER — Emergency Department (HOSPITAL_BASED_OUTPATIENT_CLINIC_OR_DEPARTMENT_OTHER)
Admission: EM | Admit: 2023-08-12 | Discharge: 2023-08-12 | Disposition: A | Discharge status: Home / Self Care | Attending: Emergency Medicine | Admitting: Emergency Medicine

## 2023-08-12 ENCOUNTER — Encounter (HOSPITAL_BASED_OUTPATIENT_CLINIC_OR_DEPARTMENT_OTHER): Payer: Self-pay | Admitting: Emergency Medicine

## 2023-08-12 DIAGNOSIS — R109 Unspecified abdominal pain: Secondary | ICD-10-CM | POA: Insufficient documentation

## 2023-08-12 DIAGNOSIS — R1110 Vomiting, unspecified: Secondary | ICD-10-CM | POA: Diagnosis present

## 2023-08-12 DIAGNOSIS — R112 Nausea with vomiting, unspecified: Secondary | ICD-10-CM | POA: Insufficient documentation

## 2023-08-12 LAB — URINALYSIS, ROUTINE W REFLEX MICROSCOPIC
Glucose, UA: NEGATIVE mg/dL
Hgb urine dipstick: NEGATIVE
Ketones, ur: 40 mg/dL — AB
Leukocytes,Ua: NEGATIVE
Nitrite: NEGATIVE
Protein, ur: 30 mg/dL — AB
Specific Gravity, Urine: >=1.03 (ref 1.005–1.030)
pH: 6 (ref 5.0–8.0)

## 2023-08-12 LAB — URINALYSIS, MICROSCOPIC (REFLEX)

## 2023-08-12 LAB — CBC
HCT: 38.7 % (ref 36.0–46.0)
Hemoglobin: 13.5 g/dL (ref 12.0–15.0)
MCH: 31.4 pg (ref 26.0–34.0)
MCHC: 34.9 g/dL (ref 30.0–36.0)
MCV: 90 fL (ref 80.0–100.0)
Platelets: 296 10*3/uL (ref 150–400)
RBC: 4.3 MIL/uL (ref 3.87–5.11)
RDW: 12.7 % (ref 11.5–15.5)
WBC: 7.6 10*3/uL (ref 4.0–10.5)
nRBC: 0 % (ref 0.0–0.2)

## 2023-08-12 LAB — PREGNANCY, URINE: Preg Test, Ur: NEGATIVE

## 2023-08-12 MED ORDER — LACTATED RINGERS IV BOLUS
1000.0000 mL | Freq: Once | INTRAVENOUS | Status: AC
  Administered 2023-08-12: 1000 mL via INTRAVENOUS

## 2023-08-12 MED ORDER — PROCHLORPERAZINE MALEATE 10 MG PO TABS: 10.0000 mg | ORAL_TABLET | Freq: Two times a day (BID) | ORAL | 0 refills | Status: AC | PRN

## 2023-08-12 MED ORDER — PROCHLORPERAZINE EDISYLATE 10 MG/2ML IJ SOLN
10.0000 mg | Freq: Once | INTRAMUSCULAR | Status: AC
  Administered 2023-08-12: 10 mg via INTRAVENOUS
  Filled 2023-08-12: qty 2

## 2023-08-12 NOTE — ED Provider Notes (Signed)
 [place] EMERGENCY DEPARTMENT AT [facility]  Provider Note  CSN: [id number] Arrival date & time: 08/12/23 9562  History Chief Complaint  Patient presents with   Emesis    Alexandra Carrillo is a 33 y.o. female reports 2 days of intractable vomiting, some abdominal discomfort. No diarrhea or fever. Not improved with zofran  at home. Does no think she is pregnant.    Home Medications Prior to Admission medications   Medication Sig Start Date End Date Taking? Authorizing Provider  prochlorperazine  (COMPAZINE ) 10 MG tablet Take 1 tablet (10 mg total) by mouth 2 (two) times daily as needed for nausea or vomiting. 08/12/23  Yes Charmayne Cooper, MD  albuterol (PROVENTIL) (5 MG/ML) 0.5% nebulizer solution Take 2.5 mg by nebulization every 6 (six) hours as needed for wheezing or shortness of breath.    [provider]  benzonatate  (TESSALON ) 200 MG capsule Take 1 capsule (200 mg total) by mouth 3 (three) times daily as needed for cough. 03/27/18   Riccio, Angela C, DO     Allergies    Penicillins   Review of Systems   Review of Systems Please see HPI for pertinent positives and negatives  Physical Exam BP (!) 134/98 (BP Location: Right Arm)   Pulse (!) 120   Temp 98.9 F (37.2 C) (Oral)   Resp 20   Ht 5\' 2"  (1.575 m)   Wt 78.5 kg   LMP 07/18/2023   SpO2 99%   BMI 31.64 kg/m   Physical Exam Vitals and nursing note reviewed.  Constitutional:      Appearance: Normal appearance.  HENT:     Head: Normocephalic and atraumatic.     Nose: Nose normal.     Mouth/Throat:     Mouth: Mucous membranes are dry.  Eyes:     Extraocular Movements: Extraocular movements intact.     Conjunctiva/sclera: Conjunctivae normal.  Cardiovascular:     Rate and Rhythm: Normal rate.  Pulmonary:     Effort: Pulmonary effort is normal.     Breath sounds: Normal breath sounds.  Abdominal:     General: Abdomen is flat.     Palpations: Abdomen is soft.     Tenderness: There is  no abdominal tenderness.  Musculoskeletal:        General: No swelling. Normal range of motion.     Cervical back: Neck supple.  Skin:    General: Skin is warm and dry.  Neurological:     General: No focal deficit present.     Mental Status: She is alert.  Psychiatric:        Mood and Affect: Mood normal.     ED Results / Procedures / Treatments   EKG None  Procedures Procedures  Medications Ordered in the ED Medications  lactated ringers  bolus 1,000 mL (0 mLs Intravenous Stopped 08/12/23 0604)  prochlorperazine  (COMPAZINE ) injection 10 mg (10 mg Intravenous Given 08/12/23 0446)    Initial Impression and Plan  Patient here with 2 days of persistent vomiting. Mild tachycardia, but otherwise exam is reassuring and abdomen is benign. Will check labs, give IVF and compazine  for symptoms.   ED Course   Clinical Course as of 08/12/23 0637  Mon Aug 12, 2023  0617 CBC is normal.  [CS]  0621 HCG is neg.  [CS]  0626 UA is clear [CS]  0635 Patient's chemistry panel has hemolyzed, however she is feeling better, tolerating PO and does not want to be stuck again. She is comfortable  going home, recommend she continue with oral hydration, Rx for compazine , PCP follow up, RTED for any other concerns.   [CS]    Clinical Course User Index [CS] Charmayne Cooper, MD     MDM Rules/Calculators/A&P Medical Decision Making Given presenting complaint, I considered that admission might be necessary. After review of results from ED lab and/or imaging studies, admission to the hospital is not indicated at this time.    Problems Addressed: Nausea and vomiting, unspecified vomiting type: acute illness or injury  Amount and/or Complexity of Data Reviewed Labs: ordered. Decision-making details documented in ED Course.  Risk Prescription drug management. Decision regarding hospitalization.     Final Clinical Impression(s) / ED Diagnoses Final diagnoses:  Nausea and vomiting,  unspecified vomiting type    Rx / DC Orders ED Discharge Orders          Ordered    prochlorperazine  (COMPAZINE ) 10 MG tablet  2 times daily PRN        08/12/23 0636             Charmayne Cooper, MD 08/12/23 [phone]

## 2023-08-12 NOTE — ED Notes (Signed)
 Unable to collect labs at this time.Patient has been vomiting x2 days. Will give fluids via IV and draw labs after. Made EDP aware.

## 2023-08-12 NOTE — ED Triage Notes (Signed)
 Vomiting and generalized abd pain that started 4/19. She reports >10 episodes in the last 24 hours. Denies diarrhea. States she hasn't been able to eat since onset. She has taken zofran  without relief. Vomiting in triage.
